# Patient Record
Sex: Male | Born: 1935 | Race: Black or African American | Hispanic: No | Smoking: Former smoker
Health system: Southern US, Community
[De-identification: ages and names within clinical notes are randomized; demographics above are authoritative.]

## PROBLEM LIST (undated history)

## (undated) DIAGNOSIS — C61 Malignant neoplasm of prostate: Secondary | ICD-10-CM

---

## 2004-03-26 ENCOUNTER — Emergency Department (HOSPITAL_COMMUNITY): Admission: EM | Admit: 2004-03-26 | Discharge: 2004-03-26 | Payer: Self-pay | Admitting: Emergency Medicine

## 2011-08-04 ENCOUNTER — Ambulatory Visit: Payer: Medicare Other

## 2013-09-22 ENCOUNTER — Encounter (HOSPITAL_COMMUNITY): Payer: Self-pay | Admitting: Emergency Medicine

## 2013-09-22 ENCOUNTER — Emergency Department (HOSPITAL_COMMUNITY)
Admission: EM | Admit: 2013-09-22 | Discharge: 2013-09-22 | Disposition: A | Discharge status: Home / Self Care | Payer: Medicare Other | Attending: Emergency Medicine | Admitting: Emergency Medicine

## 2013-09-22 DIAGNOSIS — R339 Retention of urine, unspecified: Secondary | ICD-10-CM | POA: Insufficient documentation

## 2013-09-22 DIAGNOSIS — Z87891 Personal history of nicotine dependence: Secondary | ICD-10-CM | POA: Insufficient documentation

## 2013-09-22 DIAGNOSIS — Z8546 Personal history of malignant neoplasm of prostate: Secondary | ICD-10-CM | POA: Insufficient documentation

## 2013-09-22 DIAGNOSIS — R319 Hematuria, unspecified: Secondary | ICD-10-CM | POA: Insufficient documentation

## 2013-09-22 HISTORY — DX: Malignant neoplasm of prostate: C61

## 2013-09-22 LAB — CBC WITH DIFFERENTIAL/PLATELET
BASOS ABS: 0 10*3/uL (ref 0.0–0.1)
BASOS PCT: 0 % (ref 0–1)
EOS ABS: 0 10*3/uL (ref 0.0–0.7)
EOS PCT: 0 % (ref 0–5)
HCT: 36.4 % — ABNORMAL LOW (ref 39.0–52.0)
Hemoglobin: 11.8 g/dL — ABNORMAL LOW (ref 13.0–17.0)
Lymphocytes Relative: 7 % — ABNORMAL LOW (ref 12–46)
Lymphs Abs: 0.8 10*3/uL (ref 0.7–4.0)
MCH: 27.2 pg (ref 26.0–34.0)
MCHC: 32.4 g/dL (ref 30.0–36.0)
MCV: 83.9 fL (ref 78.0–100.0)
Monocytes Absolute: 0.3 10*3/uL (ref 0.1–1.0)
Monocytes Relative: 3 % (ref 3–12)
NEUTROS PCT: 90 % — AB (ref 43–77)
Neutro Abs: 10.8 10*3/uL — ABNORMAL HIGH (ref 1.7–7.7)
PLATELETS: 275 10*3/uL (ref 150–400)
RBC: 4.34 MIL/uL (ref 4.22–5.81)
RDW: 13.5 % (ref 11.5–15.5)
WBC: 11.9 10*3/uL — ABNORMAL HIGH (ref 4.0–10.5)

## 2013-09-22 LAB — PROTIME-INR
INR: 1.06 (ref 0.00–1.49)
PROTHROMBIN TIME: 13.8 s (ref 11.6–15.2)

## 2013-09-22 LAB — COMPREHENSIVE METABOLIC PANEL
ALBUMIN: 4.3 g/dL (ref 3.5–5.2)
ALT: 10 U/L (ref 0–53)
ANION GAP: 17 — AB (ref 5–15)
AST: 18 U/L (ref 0–37)
Alkaline Phosphatase: 197 U/L — ABNORMAL HIGH (ref 39–117)
BILIRUBIN TOTAL: 0.4 mg/dL (ref 0.3–1.2)
BUN: 21 mg/dL (ref 6–23)
CALCIUM: 10.1 mg/dL (ref 8.4–10.5)
CHLORIDE: 101 meq/L (ref 96–112)
CO2: 21 mEq/L (ref 19–32)
CREATININE: 0.78 mg/dL (ref 0.50–1.35)
GFR calc Af Amer: >90 mL/min (ref >90)
GFR calc non Af Amer: 85 mL/min — ABNORMAL LOW (ref >90)
Glucose, Bld: 130 mg/dL — ABNORMAL HIGH (ref 70–99)
Potassium: 3.9 mEq/L (ref 3.7–5.3)
Sodium: 139 mEq/L (ref 137–147)
TOTAL PROTEIN: 8.3 g/dL (ref 6.0–8.3)

## 2013-09-22 LAB — URINE MICROSCOPIC-ADD ON

## 2013-09-22 LAB — URINALYSIS, ROUTINE W REFLEX MICROSCOPIC
Glucose, UA: NEGATIVE mg/dL
Ketones, ur: NEGATIVE mg/dL
NITRITE: NEGATIVE
Specific Gravity, Urine: 1.02 (ref 1.005–1.030)
UROBILINOGEN UA: 0.2 mg/dL (ref 0.0–1.0)
pH: 7.5 (ref 5.0–8.0)

## 2013-09-22 MED ORDER — CIPROFLOXACIN HCL 250 MG PO TABS: 250.0000 mg | ORAL_TABLET | Freq: Two times a day (BID) | ORAL | Status: AC

## 2013-09-22 NOTE — ED Notes (Signed)
Attempted to insert 20 fr 3way foley catheter. Unsuccessful with insertion. Hospitalist notified and may contact urology.

## 2013-09-22 NOTE — ED Notes (Signed)
Pt bladder scan 463ml. MD aware.

## 2013-09-22 NOTE — Discharge Instructions (Signed)
Urology has placed a Foley catheter and recommended that you follow up with them in clinic. We have prescribed an antibiotic (Ciprofloxacin) for a urinary tract infection. Please follow the care injections provided by her nurses for care of your Foley catheter.

## 2013-09-22 NOTE — ED Provider Notes (Signed)
CSN: [id number]     Arrival date & time 09/22/13  1536 History   First MD Initiated Contact with Patient 09/22/13 1631     Chief Complaint  Patient presents with  . Hematuria  . Dysuria  . Abdominal Pain   HPI  Wayne Lam is a 79 year old gentleman with a history of prostate cancer (remote is around 25-30 years ago) presents with hematuria, suprapubic pain, and urinary hesitancy since this morning. Patient reports that he had prostate cancer and prostatectomy remotely, and has not had any recurrent issues since. He has also not received much medical care within the interval. This morning, he noticed gross blood in his urine as well as passing moderately-sized clots into the toilet. He is also mentioned having hesitancy with his urinary stream. Otherwise, he denies any lightheadedness, fever, chills, nausea, vomiting, constipation, diarrhea, or melena or hematochezia. He denies any history of kidney stones. He also denies any use of NSAIDs or any anticoagulants.  Past Medical History  Diagnosis Date  . Prostate cancer    History reviewed. No pertinent past surgical history. History reviewed. No pertinent family history. History  Substance Use Topics  . Smoking status: Former Research scientist (life sciences)  . Smokeless tobacco: Not on file  . Alcohol Use: Yes    Review of Systems  Constitutional: Negative for fever and chills.  Respiratory: Negative for cough, chest tightness and wheezing.   Cardiovascular: Negative for chest pain and palpitations.  Gastrointestinal: Negative for nausea, vomiting, abdominal pain and diarrhea.  Genitourinary: Positive for urgency and hematuria. Negative for dysuria, flank pain, penile swelling, scrotal swelling, penile pain and testicular pain.      Allergies  Review of patient's allergies indicates no known allergies.  Home Medications   Prior to Admission medications   Medication Sig Start Date End Date Taking? Authorizing Provider  acetaminophen (TYLENOL) 500 MG  tablet Take 1,000 mg by mouth every 6 (six) hours as needed for mild pain or headache.   Yes Historical Provider, MD  ciprofloxacin (CIPRO) 250 MG tablet Take 1 tablet (250 mg total) by mouth 2 (two) times daily. 09/22/13 09/29/13  Luan Moore, MD   BP 142/76  Pulse 77  Temp(Src) 98.5 F (36.9 C) (Oral)  Resp 15  SpO2 94% Physical Exam  Constitutional: He is oriented to person, place, and time. He appears well-developed and well-nourished. No distress.  HENT:  Head: Normocephalic and atraumatic.  No conjunctival pallor.  Eyes: EOM are normal. Pupils are equal, round, and reactive to light.  Cardiovascular: Normal rate and regular rhythm.  Exam reveals no gallop and no friction rub.   No murmur heard. Pulmonary/Chest: Effort normal and breath sounds normal. No respiratory distress. He has no wheezes. He has no rales.  Abdominal: Soft. Bowel sounds are normal. He exhibits no distension. There is no tenderness.  Genitourinary:  Blood clots on patient's diaper. No penile pain. Fullness along the ventral surface of rectum.  Neurological: He is alert and oriented to person, place, and time.    ED Course  Procedures (including critical care time) Labs Review Labs Reviewed  URINALYSIS, ROUTINE W REFLEX MICROSCOPIC - Abnormal; Notable for the following:    Color, Urine RED (*)    APPearance TURBID (*)    Hgb urine dipstick LARGE (*)    Bilirubin Urine SMALL (*)    Protein, ur >300 (*)    Leukocytes, UA SMALL (*)    All other components within normal limits  CBC WITH DIFFERENTIAL - Abnormal; Notable for the  following:    WBC 11.9 (*)    Hemoglobin 11.8 (*)    HCT 36.4 (*)    Neutrophils Relative % 90 (*)    Neutro Abs 10.8 (*)    Lymphocytes Relative 7 (*)    All other components within normal limits  COMPREHENSIVE METABOLIC PANEL - Abnormal; Notable for the following:    Glucose, Bld 130 (*)    Alkaline Phosphatase 197 (*)    GFR calc non Af Amer 85 (*)    Anion gap 17 (*)     All other components within normal limits  URINE MICROSCOPIC-ADD ON - Abnormal; Notable for the following:    Bacteria, UA MANY (*)    All other components within normal limits  URINE CULTURE  PROTIME-INR    Imaging Review No results found.   EKG Interpretation None      MDM   Final diagnoses:  Urinary retention  Hematuria    Mr. Wayne Lam is a 79 year old male with a history of remote prostate cancer who presents with gross hematuria complicated by retained clots. No recent procedures or treatments within around 20 years. No complicating associated symptoms such as lightheadedness and vital signs within normal limits. Concern for recurrent disease in the context of a large prostate, though other urinary tract malignancy is also possible.  - Foley catheter  - CBC, CMP, coags  7:29 PM  - Difficulty passing Foley catheter. Will try 20 instead of 24.  - CMP remarkable for elevated alkaline phosphatase to 197.  - Patient slightly anemic at 11.8.   - UA remarkable for gross hematuria and many bacteria.  8:06 PM   - Urology consulted and will come see him.   - Urology able to insert 20 French catheter. Recommend followup as outpatient with discharge with indwelling catheter. Also cipro for UTI.    Luan Moore, MD 09/22/13 2322

## 2013-09-22 NOTE — ED Notes (Signed)
Pt c/o hematuria, dysuria, and lower abdominal cramping w/ urination.  Pain score 7/10.  Pt reports Hx of prostate "issues" but could not elaborate.  Pt is hard of hearing.

## 2013-09-22 NOTE — Consult Note (Signed)
Urology Consult  Referring physician: Marye Round, MD Reason for referral: Gross hematuria, Inability to Pl., Foley catheter  History of Present Illness: Wayne Lam is a 79 year old male. He does not seek regular medical attention. He reports a remote history of prostate cancer. He reports having surgery but has no midline incision consistent with a radical prostatectomy that would've been performed 20 years ago. My questioning he may have undergone TURP. He was cared for in the past by Wayne Lam but I could not find any records from our office. Patient reports development today of gross hematuria. He's also had some increased difficulty voiding. He had a distended bladder based on bladder scan in the ER x2 but inability for the ER staff to place a catheter. He really is unreliable regard any additional history.  Past Medical History  Diagnosis Date  . Prostate cancer    History reviewed. No pertinent past surgical history.  Medications: I have reviewed the patient's current medications. Prior to Admission:  (Not in a hospital admission)  Allergies: No Known Allergies  History reviewed. No pertinent family history.  Social History:  reports that he has quit smoking. He does not have any smokeless tobacco history on file. He reports that he drinks alcohol. He reports that he does not use illicit drugs.  ROS gross hematuria with some weakening of his urinary stream. He also complains of some increased fatigue.  Physical Exam:  Vital signs in last 24 hours: Temp:  [98.1 F (36.7 C)-98.4 F (36.9 C)] 98.4 F (36.9 C) (08/20 2030) Pulse Rate:  [66-95] 66 (08/20 2030) Resp:  [18] 18 (08/20 2030) BP: (124-152)/(61-86) 124/61 mmHg (08/20 2030) SpO2:  [94 %-97 %] 94 % (08/20 2030)  Constitutional: Vital signs reviewed. WD WN in NAD Head: Normocephalic and atraumatic   Eyes: PERRL, No scleral icterus.  Neck: Supple No  Gross JVD, mass, thyromegaly, or carotid bruit present.   Cardiovascular: RRR Pulmonary/Chest: Normal effort Abdominal: Soft. Non-tender, non-distended, bowel sounds are normal, no masses, organomegaly, or guarding present.  Large right anal hernia Genitourinary: Mild blunt meatus. No other significant abnormalities. Palpable small prostate on digital rectal exam Extremities: No cyanosis or edema  Neurological: Grossly non-focal.  Skin: Warm,very dry and intact. No rash, cyanosis   Laboratory Data:  Results for orders placed during the hospital encounter of 09/22/13 (from the past 72 hour(s))  URINALYSIS, ROUTINE W REFLEX MICROSCOPIC     Status: Abnormal   Collection Time    09/22/13  4:30 PM      Result Value Ref Range   Color, Urine RED (*) YELLOW   Comment: BIOCHEMICALS MAY BE AFFECTED BY COLOR   APPearance TURBID (*) CLEAR   Specific Gravity, Urine 1.020  1.005 - 1.030   pH 7.5  5.0 - 8.0   Glucose, UA NEGATIVE  NEGATIVE mg/dL   Hgb urine dipstick LARGE (*) NEGATIVE   Bilirubin Urine SMALL (*) NEGATIVE   Ketones, ur NEGATIVE  NEGATIVE mg/dL   Protein, ur >300 (*) NEGATIVE mg/dL   Urobilinogen, UA 0.2  0.0 - 1.0 mg/dL   Nitrite NEGATIVE  NEGATIVE   Leukocytes, UA SMALL (*) NEGATIVE  URINE MICROSCOPIC-ADD ON     Status: Abnormal   Collection Time    09/22/13  4:30 PM      Result Value Ref Range   WBC, UA 3-6  <3 WBC/hpf   RBC / HPF 11-20  <3 RBC/hpf   Bacteria, UA MANY (*) RARE   Urine-Other URINALYSIS PERFORMED  ON SUPERNATANT    CBC WITH DIFFERENTIAL     Status: Abnormal   Collection Time    09/22/13  6:11 PM      Result Value Ref Range   WBC 11.9 (*) 4.0 - 10.5 K/uL   RBC 4.34  4.22 - 5.81 MIL/uL   Hemoglobin 11.8 (*) 13.0 - 17.0 g/dL   HCT 36.4 (*) 39.0 - 52.0 %   MCV 83.9  78.0 - 100.0 fL   MCH 27.2  26.0 - 34.0 pg   MCHC 32.4  30.0 - 36.0 g/dL   RDW 13.5  11.5 - 15.5 %   Platelets 275  150 - 400 K/uL   Neutrophils Relative % 90 (*) 43 - 77 %   Neutro Abs 10.8 (*) 1.7 - 7.7 K/uL   Lymphocytes Relative 7 (*) 12 -  46 %   Lymphs Abs 0.8  0.7 - 4.0 K/uL   Monocytes Relative 3  3 - 12 %   Monocytes Absolute 0.3  0.1 - 1.0 K/uL   Eosinophils Relative 0  0 - 5 %   Eosinophils Absolute 0.0  0.0 - 0.7 K/uL   Basophils Relative 0  0 - 1 %   Basophils Absolute 0.0  0.0 - 0.1 K/uL  PROTIME-INR     Status: None   Collection Time    09/22/13  6:11 PM      Result Value Ref Range   Prothrombin Time 13.8  11.6 - 15.2 seconds   INR 1.06  0.00 - 1.49  COMPREHENSIVE METABOLIC PANEL     Status: Abnormal   Collection Time    09/22/13  6:11 PM      Result Value Ref Range   Sodium 139  137 - 147 mEq/L   Potassium 3.9  3.7 - 5.3 mEq/L   Chloride 101  96 - 112 mEq/L   CO2 21  19 - 32 mEq/L   Glucose, Bld 130 (*) 70 - 99 mg/dL   BUN 21  6 - 23 mg/dL   Creatinine, Ser 0.78  0.50 - 1.35 mg/dL   Calcium 10.1  8.4 - 10.5 mg/dL   Total Protein 8.3  6.0 - 8.3 g/dL   Albumin 4.3  3.5 - 5.2 g/dL   AST 18  0 - 37 U/L   ALT 10  0 - 53 U/L   Alkaline Phosphatase 197 (*) 39 - 117 U/L   Total Bilirubin 0.4  0.3 - 1.2 mg/dL   GFR calc non Af Amer 85 (*) >90 mL/min   GFR calc Af Amer >90  >90 mL/min   Comment: (NOTE)     The eGFR has been calculated using the CKD EPI equation.     This calculation has not been validated in all clinical situations.     eGFR's persistently <90 mL/min signify possible Chronic Kidney     Disease.   Anion gap 17 (*) 5 - 15   No results found for this or any previous visit (from the past 240 hour(s)). Creatinine:  Recent Labs  09/22/13 1811  CREATININE 0.78   Baseline Creatinine: Unknown     Impression/Assessment:  Gross hematuria. Etiology unclear. The patient reports a questionable remote history of prostate cancer. No followup now for 10-20 years per the patient.  Plan:  Bedside flexible cystoscopy was performed. The patient was prepped and draped in usual manner. Anterior urethra showed some generalized trauma from multiple catheterization attempts. There appeared to be a  false passage in the  area of what appeared to be a small prostatic urethra. Mild bladder neck contracture noted. Urine within the bladder was grossly bloody making visualization otherwise difficult. Could not rule out possibility of bladder mass. A guidewire was placed in the bladder and over a guidewire I was able to place a 20 [place]. Bladder irrigation was performed with the number of small clots obtained and a large amount of otherwise dark bloody urine. At the completion of irrigation urine was clear to very light pink in color.  Patient should have urine culture performed today and be discharged with an indwelling Foley catheter. He will need followup in our office early next week. He should have repeat cystoscopy was hematuria resolves or is improved. The patient should have PSA test and will need probably some upper tract testing as well. Would empirically cover him with some antibiotics.  Anyela Napierkowski S 09/22/2013, 9:40 PM

## 2013-09-26 NOTE — ED Provider Notes (Signed)
I saw and evaluated the patient, reviewed the resident's note and I agree with the findings and plan.  Pt presented to the ED for gross hematuria.  Remote history of prostate CA.  Bladder scan showed a component of urinary retention. Foley catheter was attempted in the ED without success. Dr Risa Grill, urology, was consulted and evaluated the patient in the ED. Successfully placed a catheter.  Bladder was irrigated. Pt was discharged in stable condition with a foley.  Follow up with urology as an outpatient.  Dorie Rank, MD 09/26/13 [phone]

## 2013-09-27 LAB — URINE CULTURE

## 2013-09-28 ENCOUNTER — Telehealth (HOSPITAL_COMMUNITY): Payer: Self-pay

## 2013-09-28 NOTE — ED Notes (Signed)
Post ED Visit - Positive Culture Follow-up  Culture report reviewed by antimicrobial stewardship pharmacist: []  Wes Dulaney, Pharm.D., BCPS [x]  Heide Guile, Pharm.D., BCPS []  Alycia Rossetti, Pharm.D., BCPS []  [place], [place].D., BCPS, AAHIVP []  Legrand Como, Pharm.D., BCPS, AAHIVP []  Hassie Bruce, Pharm.D. []  Milus Glazier, [place].D.  Positive urine culture Treated with cipro, organism sensitive to the same and no further patient follow-up is required at this time.  Ileene Musa 09/28/2013, 10:24 AM

## 2013-10-12 ENCOUNTER — Other Ambulatory Visit (HOSPITAL_COMMUNITY): Payer: Self-pay | Admitting: Urology

## 2013-10-12 DIAGNOSIS — R310 Gross hematuria: Secondary | ICD-10-CM

## 2013-10-27 ENCOUNTER — Ambulatory Visit (HOSPITAL_COMMUNITY): Admission: RE | Admit: 2013-10-27 | Payer: Self-pay | Source: Ambulatory Visit

## 2018-11-23 ENCOUNTER — Emergency Department (HOSPITAL_COMMUNITY)
Admission: EM | Admit: 2018-11-23 | Discharge: 2018-11-24 | Disposition: A | Discharge status: Home / Self Care | Payer: Self-pay | Attending: Emergency Medicine | Admitting: Emergency Medicine

## 2018-11-23 ENCOUNTER — Other Ambulatory Visit: Payer: Self-pay

## 2018-11-23 ENCOUNTER — Emergency Department (HOSPITAL_COMMUNITY): Payer: Self-pay

## 2018-11-23 ENCOUNTER — Encounter (HOSPITAL_COMMUNITY): Payer: Self-pay

## 2018-11-23 DIAGNOSIS — R2241 Localized swelling, mass and lump, right lower limb: Secondary | ICD-10-CM | POA: Diagnosis present

## 2018-11-23 DIAGNOSIS — K4090 Unilateral inguinal hernia, without obstruction or gangrene, not specified as recurrent: Secondary | ICD-10-CM | POA: Insufficient documentation

## 2018-11-23 DIAGNOSIS — Z8546 Personal history of malignant neoplasm of prostate: Secondary | ICD-10-CM | POA: Insufficient documentation

## 2018-11-23 DIAGNOSIS — Z87891 Personal history of nicotine dependence: Secondary | ICD-10-CM | POA: Diagnosis not present

## 2018-11-23 LAB — LIPASE, BLOOD: Lipase: 33 U/L (ref 11–51)

## 2018-11-23 LAB — COMPREHENSIVE METABOLIC PANEL
ALT: 13 U/L (ref 0–44)
AST: 19 U/L (ref 15–41)
Albumin: 3.8 g/dL (ref 3.5–5.0)
Alkaline Phosphatase: 212 U/L — ABNORMAL HIGH (ref 38–126)
Anion gap: 10 (ref 5–15)
BUN: 21 mg/dL (ref 8–23)
CO2: 26 mmol/L (ref 22–32)
Calcium: 9.2 mg/dL (ref 8.9–10.3)
Chloride: 102 mmol/L (ref 98–111)
Creatinine, Ser: 0.93 mg/dL (ref 0.61–1.24)
GFR calc Af Amer: >60 mL/min (ref >60)
GFR calc non Af Amer: >60 mL/min (ref >60)
Glucose, Bld: 115 mg/dL — ABNORMAL HIGH (ref 70–99)
Potassium: 4 mmol/L (ref 3.5–5.1)
Sodium: 138 mmol/L (ref 135–145)
Total Bilirubin: 0.4 mg/dL (ref 0.3–1.2)
Total Protein: 7.9 g/dL (ref 6.5–8.1)

## 2018-11-23 LAB — URINALYSIS, ROUTINE W REFLEX MICROSCOPIC
Bilirubin Urine: NEGATIVE
Glucose, UA: NEGATIVE mg/dL
Hgb urine dipstick: NEGATIVE
Ketones, ur: NEGATIVE mg/dL
Nitrite: POSITIVE — AB
Protein, ur: 100 mg/dL — AB
Specific Gravity, Urine: 1.018 (ref 1.005–1.030)
WBC, UA: >50 WBC/hpf — ABNORMAL HIGH (ref 0–5)
pH: 8 (ref 5.0–8.0)

## 2018-11-23 LAB — CBC
HCT: 39.8 % (ref 39.0–52.0)
Hemoglobin: 12.5 g/dL — ABNORMAL LOW (ref 13.0–17.0)
MCH: 26.9 pg (ref 26.0–34.0)
MCHC: 31.4 g/dL (ref 30.0–36.0)
MCV: 85.8 fL (ref 80.0–100.0)
Platelets: 289 10*3/uL (ref 150–400)
RBC: 4.64 MIL/uL (ref 4.22–5.81)
RDW: 13.4 % (ref 11.5–15.5)
WBC: 5 10*3/uL (ref 4.0–10.5)
nRBC: 0 % (ref 0.0–0.2)

## 2018-11-23 MED ORDER — IOHEXOL 300 MG/ML  SOLN
100.0000 mL | Freq: Once | INTRAMUSCULAR | Status: AC | PRN
  Administered 2018-11-23: 100 mL via INTRAVENOUS

## 2018-11-23 MED ORDER — SODIUM CHLORIDE 0.9% FLUSH: 3.0000 mL | Freq: Once | INTRAVENOUS | Status: DC

## 2018-11-23 NOTE — ED Triage Notes (Signed)
Pt accompanied by son who reports they were at an outpatient clinic today and was told he has a hernia that needs surgery. Pt denies any pain but his scrotum is very swollen.

## 2018-11-23 NOTE — ED Notes (Signed)
Patient transported to CT 

## 2018-11-23 NOTE — ED Provider Notes (Signed)
[facility] EMERGENCY DEPARTMENT Provider Note   CSN: [id number] Arrival date & time: 11/23/18  1610     History   Chief Complaint Chief Complaint  Patient presents with  . Groin Swelling  . Hernia    HPI Wayne Lam is a 84 y.o. male.     Patient is an 84 year old male with past medical history of prostate cancer.  He presents today for evaluation of swelling and discomfort in his right inguinal region.  This is been present for many months, however has been increasing in size and discomfort recently.  He denies any vomiting.  He denies any constipation or diarrhea or bloody stool.  He was seen today at an outpatient clinic, then referred here for possible surgical intervention.  The history is provided by the patient.    Past Medical History:  Diagnosis Date  . Prostate cancer ([place])     There are no active problems to display for this patient.   History reviewed. No pertinent surgical history.      Home Medications    Prior to Admission medications   Medication Sig Start Date End Date Taking? Authorizing Provider  acetaminophen (TYLENOL) 500 MG tablet Take 1,000 mg by mouth every 6 (six) hours as needed for mild pain or headache.    [provider]    Family History No family history on file.  Social History Social History   Tobacco Use  . Smoking status: Former Smoker  Substance Use Topics  . Alcohol use: Yes  . Drug use: No     Allergies   Patient has no known allergies.   Review of Systems Review of Systems  All other systems reviewed and are negative.    Physical Exam Updated Vital Signs BP (!) 161/76 (BP Location: Right Arm)   Pulse (!) 52   Temp 97.7 F (36.5 C) (Oral)   Resp 16   SpO2 98%   Physical Exam Vitals signs and nursing note reviewed.  Constitutional:      General: He is not in acute distress.    Appearance: He is well-developed. He is not diaphoretic.  HENT:     Head: Normocephalic  and atraumatic.  Neck:     Musculoskeletal: Normal range of motion and neck supple.  Cardiovascular:     Rate and Rhythm: Normal rate and regular rhythm.     Heart sounds: No murmur. No friction rub.  Pulmonary:     Effort: Pulmonary effort is normal. No respiratory distress.     Breath sounds: Normal breath sounds. No wheezing or rales.  Abdominal:     General: Bowel sounds are normal. There is no distension.     Palpations: Abdomen is soft.     Tenderness: There is no abdominal tenderness.     Comments: There is a large right-sided inguinal hernia present that extends into the scrotum.  Musculoskeletal: Normal range of motion.  Skin:    General: Skin is warm and dry.  Neurological:     Mental Status: He is alert and oriented to person, place, and time.     Coordination: Coordination normal.      ED Treatments / Results  Labs (all labs ordered are listed, but only abnormal results are displayed) Labs Reviewed  COMPREHENSIVE METABOLIC PANEL - Abnormal; Notable for the following components:      Result Value   Glucose, Bld 115 (*)    Alkaline Phosphatase 212 (*)    All other components within normal  limits  CBC - Abnormal; Notable for the following components:   Hemoglobin 12.5 (*)    All other components within normal limits  URINALYSIS, ROUTINE W REFLEX MICROSCOPIC - Abnormal; Notable for the following components:   APPearance CLOUDY (*)    Protein, ur 100 (*)    Nitrite POSITIVE (*)    Leukocytes,Ua MODERATE (*)    WBC, UA >50 (*)    Bacteria, UA MANY (*)    All other components within normal limits  LIPASE, BLOOD    EKG None  Radiology No results found.  Procedures Procedures (including critical care time)  Medications Ordered in ED Medications  sodium chloride flush (NS) 0.9 % injection 3 mL (has no administration in time range)     Initial Impression / Assessment and Plan / ED Course  I have reviewed the triage vital signs and the nursing notes.   Pertinent labs & imaging results that were available during my care of the patient were reviewed by me and considered in my medical decision making (see chart for details).  Patient presenting with swelling to his groin.  He was referred here for possible surgery for hernia repair.  Patient has a partially reducible, very large inguinal hernia with no other abnormal findings on CT scan.  There is no evidence for bowel obstruction or strangulation.  Patient will be referred to general surgery as an outpatient.  Nothing today appears emergent.  Final Clinical Impressions(s) / ED Diagnoses   Final diagnoses:  None    ED Discharge Orders    None       Veryl Speak, MD 11/24/18 [phone]

## 2018-11-24 NOTE — ED Notes (Signed)
Patient verbalizes understanding of discharge instructions. Opportunity for questioning and answers were provided. Armband removed by staff, pt discharged from ED.  

## 2018-11-24 NOTE — Discharge Instructions (Addendum)
Call central [facility] office to schedule an appointment regarding your hernia.  The contact information has been provided in this discharge summary for you to call and make these arrangements.  Return to the emergency department in the meantime if your pain worsens, you develop vomiting, bloody stools, or other new and concerning symptoms.

## 2018-12-02 NOTE — Patient Instructions (Addendum)
DUE TO COVID-19 ONLY ONE VISITOR IS ALLOWED TO COME WITH YOU AND STAY IN THE WAITING ROOM ONLY DURING PRE OP AND PROCEDURE DAY OF SURGERY. THE 1 VISITOR MAY VISIT WITH YOU AFTER SURGERY IN YOUR PRIVATE ROOM DURING VISITING HOURS ONLY!  YOU NEED TO HAVE A COVID 19 TEST ON__10/31_____ @_______ , THIS TEST MUST BE DONE BEFORE SURGERY, COME  [place], [address] [place] , [address].  ([place]) ONCE YOUR COVID TEST IS COMPLETED, PLEASE BEGIN THE QUARANTINE INSTRUCTIONS AS OUTLINED IN YOUR HANDOUT.                [place]    Your procedure is scheduled on: 12/08/18   Report to [facility] Main  Entrance   Report to admitting at  12:00 PM     Call this number if you have problems the morning of surgery [phone]    Do not eat food After Midnight.   YOU MAY HAVE CLEAR LIQUIDS FROM MIDNIGHT UNTIL 11:00 AM    CLEAR LIQUID DIET   Foods Allowed                                                                     Foods Excluded  Coffee and tea, regular and decaf                             liquids that you cannot  Plain Jell-O any favor except red or purple                                           see through such as: Fruit ices (not with fruit pulp)                                     milk, soups, orange juice  Iced Popsicles                                    All solid food Carbonated beverages, regular and diet                                    Cranberry, grape and apple juices Sports drinks like Gatorade Lightly seasoned clear broth or consume(fat free) Sugar, honey syrup     . At 11:00 AM Please finish the prescribed Pre-Surgery  drink.   Nothing by mouth after you finish the  drink !   BRUSH YOUR TEETH MORNING OF SURGERY AND RINSE YOUR MOUTH OUT, NO CHEWING GUM CANDY OR MINTS.     Take these medicines the morning of surgery with A SIP OF WATER: none                                 You may not have any metal on your body including  piercings              Do not wear jewelry,  lotions, powders or  deodorant                     Men may shave face and neck.   Do not bring valuables to the hospital. [place].  Contacts, dentures or bridgework may not be worn into surgery.      Patients discharged the day of surgery will not be allowed to drive home.   IF YOU ARE HAVING SURGERY AND GOING HOME THE SAME DAY, YOU MUST HAVE AN ADULT TO DRIVE YOU HOME AND BE WITH YOU FOR 24 HOURS.   YOU MAY GO HOME BY TAXI OR UBER OR ORTHERWISE, BUT AN ADULT MUST ACCOMPANY YOU HOME AND STAY WITH YOU FOR 24 HOURS.  Name and phone number of your driver:  Special Instructions: N/A              Please read over the following fact sheets you were given: _____________________________________________________________________             [facility] - Preparing for Surgery  Before surgery, you can play an important role.   Because skin is not sterile, your skin needs to be as free of germs as possible .  You can reduce the number of germs on your skin by washing with CHG (chlorahexidine gluconate) soap before surgery.   CHG is an antiseptic cleaner which kills germs and bonds with the skin to continue killing germs even after washing. Please DO NOT use if you have an allergy to CHG or antibacterial soaps .  If your skin becomes reddened/irritated stop using the CHG and inform your nurse when you arrive at Short Stay.  You may shave your face/neck.  Please follow these instructions carefully:  1.  Shower with CHG Soap the night before surgery and the  morning of Surgery.  2.  If you choose to wash your hair, wash your hair first as usual with your  normal  shampoo.  3.  After you shampoo, rinse your hair and body thoroughly to remove the  shampoo.                                        4.  Use CHG as you would any other liquid soap.  You can apply chg directly  to the skin and wash                        Gently with a scrungie or clean washcloth.  5.  Apply the CHG Soap to your body ONLY FROM THE NECK DOWN.   Do not use on face/ open                           Wound or open sores. Avoid contact with eyes, ears mouth and genitals (private parts).                       Wash face,  Genitals (private parts) with your normal soap.             6.  Wash thoroughly, paying special attention to the area  where your surgery  will be performed.  7.  Thoroughly rinse your body with warm water from the neck down.  8.  DO NOT shower/wash with your normal soap after using and rinsing off  the CHG Soap.             9.  Pat yourself dry with a clean towel.            10.  Wear clean pajamas.            11.  Place clean sheets on your bed the night of your first shower and do not  sleep with pets. Day of Surgery : Do not apply any lotions/deodorants the morning of surgery.  Please wear clean clothes to the hospital/surgery center.  FAILURE TO FOLLOW THESE INSTRUCTIONS MAY RESULT IN THE CANCELLATION OF YOUR SURGERY PATIENT SIGNATURE_________________________________  NURSE SIGNATURE__________________________________  ________________________________________________________________________

## 2018-12-03 ENCOUNTER — Ambulatory Visit: Payer: Self-pay | Admitting: General Surgery

## 2018-12-04 ENCOUNTER — Other Ambulatory Visit (HOSPITAL_COMMUNITY)
Admission: RE | Admit: 2018-12-04 | Discharge: 2018-12-04 | Disposition: A | Discharge status: Home / Self Care | Payer: Self-pay | Source: Ambulatory Visit | Attending: General Surgery | Admitting: General Surgery

## 2018-12-04 DIAGNOSIS — Z01812 Encounter for preprocedural laboratory examination: Secondary | ICD-10-CM | POA: Insufficient documentation

## 2018-12-04 DIAGNOSIS — Z20828 Contact with and (suspected) exposure to other viral communicable diseases: Secondary | ICD-10-CM | POA: Insufficient documentation

## 2018-12-05 LAB — NOVEL CORONAVIRUS, NAA (HOSP ORDER, SEND-OUT TO REF LAB; TAT 18-24 HRS): SARS-CoV-2, NAA: NOT DETECTED

## 2018-12-06 ENCOUNTER — Other Ambulatory Visit: Payer: Self-pay

## 2018-12-06 ENCOUNTER — Encounter (HOSPITAL_COMMUNITY)
Admission: RE | Admit: 2018-12-06 | Discharge: 2018-12-06 | Disposition: A | Discharge status: Home / Self Care | Payer: Self-pay | Source: Ambulatory Visit | Attending: General Surgery | Admitting: General Surgery

## 2018-12-06 ENCOUNTER — Encounter (HOSPITAL_COMMUNITY): Payer: Self-pay

## 2018-12-06 DIAGNOSIS — Z01812 Encounter for preprocedural laboratory examination: Secondary | ICD-10-CM | POA: Insufficient documentation

## 2018-12-06 DIAGNOSIS — K4090 Unilateral inguinal hernia, without obstruction or gangrene, not specified as recurrent: Secondary | ICD-10-CM | POA: Insufficient documentation

## 2018-12-06 LAB — CBC
HCT: 38.7 % — ABNORMAL LOW (ref 39.0–52.0)
Hemoglobin: 12 g/dL — ABNORMAL LOW (ref 13.0–17.0)
MCH: 26.7 pg (ref 26.0–34.0)
MCHC: 31 g/dL (ref 30.0–36.0)
MCV: 86.2 fL (ref 80.0–100.0)
Platelets: 281 10*3/uL (ref 150–400)
RBC: 4.49 MIL/uL (ref 4.22–5.81)
RDW: 13.5 % (ref 11.5–15.5)
WBC: 5.4 10*3/uL (ref 4.0–10.5)
nRBC: 0 % (ref 0.0–0.2)

## 2018-12-06 MED ORDER — ENSURE PRE-SURGERY PO LIQD
296.0000 mL | Freq: Once | ORAL | Status: DC
  Filled 2018-12-06: qty 296

## 2018-12-06 NOTE — Progress Notes (Signed)
PCP - none Cardiologist - none  Chest x-ray - no EKG - no Stress Test - no ECHO - no Cardiac Cath - no  Sleep Study - no CPAP -   Fasting Blood Sugar - NA Checks Blood Sugar _____ times a day  Blood Thinner Instructions:NA Aspirin Instructions: Last Dose:  Anesthesia review:   Patient denies shortness of breath, fever, cough and chest pain at PAT appointment yes  Patient verbalized understanding of instructions that were given to them at the PAT appointment. Patient was also instructed that they will need to review over the PAT instructions again at home before surgery. Yes Pt is very HOH but was told to bring his hearing aids with him to the hospital

## 2018-12-08 ENCOUNTER — Ambulatory Visit (HOSPITAL_COMMUNITY): Payer: Self-pay | Admitting: Anesthesiology

## 2018-12-08 ENCOUNTER — Other Ambulatory Visit: Payer: Self-pay

## 2018-12-08 ENCOUNTER — Encounter (HOSPITAL_COMMUNITY): Payer: Self-pay | Admitting: Anesthesiology

## 2018-12-08 ENCOUNTER — Ambulatory Visit (HOSPITAL_COMMUNITY): Payer: Self-pay | Admitting: Physician Assistant

## 2018-12-08 ENCOUNTER — Encounter (HOSPITAL_COMMUNITY)
Admission: RE | Disposition: A | Discharge status: Home / Self Care | Payer: Self-pay | Source: Home / Self Care | Attending: General Surgery

## 2018-12-08 ENCOUNTER — Ambulatory Visit (HOSPITAL_COMMUNITY)
Admission: RE | Admit: 2018-12-08 | Discharge: 2018-12-08 | Disposition: A | Discharge status: Home / Self Care | Payer: Self-pay | Attending: General Surgery | Admitting: General Surgery

## 2018-12-08 DIAGNOSIS — Z8546 Personal history of malignant neoplasm of prostate: Secondary | ICD-10-CM | POA: Insufficient documentation

## 2018-12-08 DIAGNOSIS — K4090 Unilateral inguinal hernia, without obstruction or gangrene, not specified as recurrent: Secondary | ICD-10-CM | POA: Insufficient documentation

## 2018-12-08 DIAGNOSIS — Z87891 Personal history of nicotine dependence: Secondary | ICD-10-CM | POA: Insufficient documentation

## 2018-12-08 HISTORY — PX: INGUINAL HERNIA REPAIR: SHX194

## 2018-12-08 SURGERY — REPAIR, HERNIA, INGUINAL, ADULT
Anesthesia: Regional | Laterality: Right

## 2018-12-08 MED ORDER — DEXAMETHASONE SODIUM PHOSPHATE 10 MG/ML IJ SOLN
INTRAMUSCULAR | Status: AC
  Filled 2018-12-08: qty 1

## 2018-12-08 MED ORDER — CHLORHEXIDINE GLUCONATE CLOTH 2 % EX PADS: 6.0000 | MEDICATED_PAD | Freq: Once | CUTANEOUS | Status: DC

## 2018-12-08 MED ORDER — BUPIVACAINE LIPOSOME 1.3 % IJ SUSP
20.0000 mL | Freq: Once | INTRAMUSCULAR | Status: DC
  Filled 2018-12-08: qty 20

## 2018-12-08 MED ORDER — PROPOFOL 10 MG/ML IV BOLUS
INTRAVENOUS | Status: AC
  Filled 2018-12-08: qty 20

## 2018-12-08 MED ORDER — SUCCINYLCHOLINE CHLORIDE 200 MG/10ML IV SOSY
PREFILLED_SYRINGE | INTRAVENOUS | Status: DC | PRN
  Administered 2018-12-08: 100 mg via INTRAVENOUS

## 2018-12-08 MED ORDER — IBUPROFEN 800 MG PO TABS: 800.0000 mg | ORAL_TABLET | Freq: Three times a day (TID) | ORAL | 0 refills | Status: AC | PRN

## 2018-12-08 MED ORDER — ONDANSETRON HCL 4 MG/2ML IJ SOLN
INTRAMUSCULAR | Status: AC
  Filled 2018-12-08: qty 2

## 2018-12-08 MED ORDER — DEXAMETHASONE SODIUM PHOSPHATE 10 MG/ML IJ SOLN
INTRAMUSCULAR | Status: DC | PRN
  Administered 2018-12-08: 10 mg via INTRAVENOUS

## 2018-12-08 MED ORDER — 0.9 % SODIUM CHLORIDE (POUR BTL) OPTIME
TOPICAL | Status: DC | PRN
  Administered 2018-12-08: 13:00:00 1000 mL

## 2018-12-08 MED ORDER — EPHEDRINE 5 MG/ML INJ
INTRAVENOUS | Status: AC
  Filled 2018-12-08: qty 10

## 2018-12-08 MED ORDER — FENTANYL CITRATE (PF) 100 MCG/2ML IJ SOLN
50.0000 ug | INTRAMUSCULAR | Status: AC
  Administered 2018-12-08: 12:00:00 100 ug via INTRAVENOUS
  Filled 2018-12-08: qty 2

## 2018-12-08 MED ORDER — ACETAMINOPHEN 500 MG PO TABS
1000.0000 mg | ORAL_TABLET | ORAL | Status: AC
  Administered 2018-12-08: 12:00:00 1000 mg via ORAL
  Filled 2018-12-08: qty 2

## 2018-12-08 MED ORDER — PROPOFOL 10 MG/ML IV BOLUS
INTRAVENOUS | Status: DC | PRN
  Administered 2018-12-08: 100 mg via INTRAVENOUS

## 2018-12-08 MED ORDER — ONDANSETRON HCL 4 MG/2ML IJ SOLN: 4.0000 mg | Freq: Once | INTRAMUSCULAR | Status: DC | PRN

## 2018-12-08 MED ORDER — SUCCINYLCHOLINE CHLORIDE 200 MG/10ML IV SOSY
PREFILLED_SYRINGE | INTRAVENOUS | Status: AC
  Filled 2018-12-08: qty 10

## 2018-12-08 MED ORDER — SUGAMMADEX SODIUM 200 MG/2ML IV SOLN
INTRAVENOUS | Status: DC | PRN
  Administered 2018-12-08: 125 mg via INTRAVENOUS

## 2018-12-08 MED ORDER — FENTANYL CITRATE (PF) 100 MCG/2ML IJ SOLN
INTRAMUSCULAR | Status: AC
  Filled 2018-12-08: qty 2

## 2018-12-08 MED ORDER — ROPIVACAINE HCL 5 MG/ML IJ SOLN
INTRAMUSCULAR | Status: DC | PRN
  Administered 2018-12-08: 30 mL via PERINEURAL

## 2018-12-08 MED ORDER — ONDANSETRON HCL 4 MG/2ML IJ SOLN
INTRAMUSCULAR | Status: DC | PRN
  Administered 2018-12-08: 4 mg via INTRAVENOUS

## 2018-12-08 MED ORDER — LACTATED RINGERS IV SOLN
INTRAVENOUS | Status: DC
  Administered 2018-12-08: 12:00:00 via INTRAVENOUS

## 2018-12-08 MED ORDER — MIDAZOLAM HCL 2 MG/2ML IJ SOLN
1.0000 mg | INTRAMUSCULAR | Status: DC
  Filled 2018-12-08: qty 2

## 2018-12-08 MED ORDER — OXYCODONE HCL 5 MG PO TABS: 5.0000 mg | ORAL_TABLET | Freq: Four times a day (QID) | ORAL | 0 refills | Status: AC | PRN

## 2018-12-08 MED ORDER — CEFAZOLIN SODIUM-DEXTROSE 2-4 GM/100ML-% IV SOLN
2.0000 g | INTRAVENOUS | Status: AC
  Administered 2018-12-08: 2 g via INTRAVENOUS
  Filled 2018-12-08: qty 100

## 2018-12-08 MED ORDER — FENTANYL CITRATE (PF) 100 MCG/2ML IJ SOLN: 25.0000 ug | INTRAMUSCULAR | Status: DC | PRN

## 2018-12-08 MED ORDER — BUPIVACAINE HCL 0.5 % IJ SOLN
INTRAMUSCULAR | Status: DC | PRN
  Administered 2018-12-08: 30 mL

## 2018-12-08 MED ORDER — BUPIVACAINE HCL (PF) 0.5 % IJ SOLN
INTRAMUSCULAR | Status: AC
  Filled 2018-12-08: qty 30

## 2018-12-08 MED ORDER — LIDOCAINE 2% (20 MG/ML) 5 ML SYRINGE
INTRAMUSCULAR | Status: DC | PRN
  Administered 2018-12-08: 60 mg via INTRAVENOUS

## 2018-12-08 MED ORDER — LIDOCAINE 2% (20 MG/ML) 5 ML SYRINGE
INTRAMUSCULAR | Status: AC
  Filled 2018-12-08: qty 5

## 2018-12-08 MED ORDER — EPHEDRINE SULFATE-NACL 50-0.9 MG/10ML-% IV SOSY
PREFILLED_SYRINGE | INTRAVENOUS | Status: DC | PRN
  Administered 2018-12-08 (×2): 10 mg via INTRAVENOUS

## 2018-12-08 MED ORDER — FENTANYL CITRATE (PF) 100 MCG/2ML IJ SOLN
INTRAMUSCULAR | Status: DC | PRN
  Administered 2018-12-08 (×2): 50 ug via INTRAVENOUS

## 2018-12-08 MED ORDER — ROCURONIUM BROMIDE 10 MG/ML (PF) SYRINGE
PREFILLED_SYRINGE | INTRAVENOUS | Status: DC | PRN
  Administered 2018-12-08: 30 mg via INTRAVENOUS

## 2018-12-08 MED ORDER — CELECOXIB 200 MG PO CAPS
200.0000 mg | ORAL_CAPSULE | ORAL | Status: AC
  Administered 2018-12-08: 200 mg via ORAL
  Filled 2018-12-08: qty 1

## 2018-12-08 MED ORDER — ROCURONIUM BROMIDE 10 MG/ML (PF) SYRINGE
PREFILLED_SYRINGE | INTRAVENOUS | Status: AC
  Filled 2018-12-08: qty 10

## 2018-12-08 SURGICAL SUPPLY — 51 items
ADH SKN CLS APL DERMABOND .7 (GAUZE/BANDAGES/DRESSINGS) ×1
APL PRP STRL LF DISP 70% ISPRP (MISCELLANEOUS) ×1
APL SKNCLS STERI-STRIP NONHPOA (GAUZE/BANDAGES/DRESSINGS)
BENZOIN TINCTURE PRP APPL 2/3 (GAUZE/BANDAGES/DRESSINGS) ×1 IMPLANT
BLADE SURG 15 STRL LF DISP TIS (BLADE) ×1 IMPLANT
BLADE SURG 15 STRL SS (BLADE) ×2
CELLS DAT CNTRL 66122 CELL SVR (MISCELLANEOUS) IMPLANT
CHLORAPREP W/TINT 26 (MISCELLANEOUS) ×2 IMPLANT
COVER SURGICAL LIGHT HANDLE (MISCELLANEOUS) ×2 IMPLANT
COVER WAND RF STERILE (DRAPES) ×2 IMPLANT
DECANTER SPIKE VIAL GLASS SM (MISCELLANEOUS) ×2 IMPLANT
DERMABOND ADVANCED (GAUZE/BANDAGES/DRESSINGS) ×1
DERMABOND ADVANCED .7 DNX12 (GAUZE/BANDAGES/DRESSINGS) IMPLANT
DRAIN PENROSE 18X1/2 LTX STRL (DRAIN) ×1 IMPLANT
DRAPE LAPAROTOMY TRNSV 102X78 (DRAPES) ×2 IMPLANT
DRSG TEGADERM 4X4.75 (GAUZE/BANDAGES/DRESSINGS) IMPLANT
DRSG TELFA PLUS 4X6 ADH ISLAND (GAUZE/BANDAGES/DRESSINGS) ×1 IMPLANT
ELECT PENCIL ROCKER SW 15FT (MISCELLANEOUS) ×2 IMPLANT
ELECT REM PT RETURN 15FT ADLT (MISCELLANEOUS) ×2 IMPLANT
GAUZE SPONGE 4X4 12PLY STRL (GAUZE/BANDAGES/DRESSINGS) ×1 IMPLANT
GLOVE BIOGEL PI IND STRL 7.0 (GLOVE) IMPLANT
GLOVE BIOGEL PI INDICATOR 7.0 (GLOVE)
GLOVE SURG SS PI 7.0 STRL IVOR (GLOVE) ×2 IMPLANT
GOWN STRL REUS W/TWL LRG LVL3 (GOWN DISPOSABLE) ×2 IMPLANT
GOWN STRL REUS W/TWL XL LVL3 (GOWN DISPOSABLE) ×2 IMPLANT
KIT BASIN OR (CUSTOM PROCEDURE TRAY) ×2 IMPLANT
KIT TURNOVER KIT A (KITS) ×1 IMPLANT
MESH HERNIA 3X6 (Mesh General) ×1 IMPLANT
NEEDLE HYPO 22GX1.5 SAFETY (NEEDLE) ×2 IMPLANT
PACK BASIC VI WITH GOWN DISP (CUSTOM PROCEDURE TRAY) ×2 IMPLANT
RETRACTOR WND ALEXIS 18 MED (MISCELLANEOUS) IMPLANT
RETRACTOR WND ALEXIS 25 LRG (MISCELLANEOUS) IMPLANT
RTRCTR WOUND ALEXIS 18CM MED (MISCELLANEOUS)
RTRCTR WOUND ALEXIS 25CM LRG (MISCELLANEOUS)
SOL PREP PROV IODINE SCRUB 4OZ (MISCELLANEOUS) ×1 IMPLANT
SPONGE LAP 18X18 RF (DISPOSABLE) IMPLANT
SPONGE LAP 4X18 RFD (DISPOSABLE) ×1 IMPLANT
STRIP CLOSURE SKIN 1/2X4 (GAUZE/BANDAGES/DRESSINGS) IMPLANT
SUT MNCRL AB 4-0 PS2 18 (SUTURE) ×2 IMPLANT
SUT PDS AB 2-0 CT2 27 (SUTURE) ×1 IMPLANT
SUT PROLENE 2 0 CT2 30 (SUTURE) ×4 IMPLANT
SUT SILK 2 0 SH CR/8 (SUTURE) IMPLANT
SUT VIC AB 2-0 CT1 27 (SUTURE) ×2
SUT VIC AB 2-0 CT1 TAPERPNT 27 (SUTURE) ×1 IMPLANT
SUT VIC AB 3-0 SH 27 (SUTURE) ×4
SUT VIC AB 3-0 SH 27XBRD (SUTURE) ×2 IMPLANT
SYR BULB IRRIGATION 50ML (SYRINGE) ×2 IMPLANT
SYR CONTROL 10ML LL (SYRINGE) ×2 IMPLANT
TOWEL OR 17X26 10 PK STRL BLUE (TOWEL DISPOSABLE) ×2 IMPLANT
TOWEL OR NON WOVEN STRL DISP B (DISPOSABLE) ×2 IMPLANT
YANKAUER SUCT BULB TIP 10FT TU (MISCELLANEOUS) ×2 IMPLANT

## 2018-12-08 NOTE — Op Note (Signed)
Preop diagnosis: right inguinal hernia  Postop diagnosis: right direct inguinal hernia  Procedure: open Right inguinal hernia repair with mesh  Surgeon: Gurney Maxin, M.D.  Asst: none  Anesthesia: Gen.   Indications for procedure: Wayne Lam is a 84 y.o. male with symptoms of pain and enlarging Right inguinal hernia(s). After discussing risks, alternatives and benefits he decided on open repair and was brought to day surgery for repair.  Description of procedure: The patient was brought into the operative suite, placed supine. Anesthesia was administered with endotracheal tube. Patient was strapped in place. The patient was prepped and draped in the usual sterile fashion.  The anterior superior iliac spine and pubic tubercle were identified on the Right side. An incision was made 1cm above the connecting line, representative of the location of the inguinal ligament. The subcutaneous tissue was bluntly dissected, scarpa's fascia was dissected away. The external abdominal oblique fascia was identified and sharply opened down to the external inguinal ring. The conjoint tendon and inguinal ligament were identified. The cord structures and sac were dissected free of the surrounding tissue in 360 degrees. A penrose drain was used to encircle the contents. The cremasteric fibers were dissected free of the contents of the cord and hernia sac. The hernia sac was entered and reduced. The majority of small intestine and right colon was found in the sac. The cord structures (vessels and vas deferens) were identified and carefully dissected away from the hernia sac. The defect appeared to be direct and large. The hernia sac was dissected down to the internal inguinal ring. Preperitoneal fat was identified showing appropriate dissection. The sac was divided and sutured closed with 3-0 vicryl. The sac was then reduced into the preperitoneal space. 3 0 PDS was used to bring the inguinal ligament to the  conjoint tendon to recreate the floor A 3x6 Bard  mesh was then used to close the defect and reinforce the floor. The mesh was sutured to the lacunar ligament and inguinal ligament using a 2-0 prolene in running fashion. Next the superior edge of the mesh was sutured to the conjoined tendon using a 2-0 running Prolene. An additional 2-0 Prolene was used to suture the tail ends of the mesh together re-creating the deep ring. Cord structures are running in a neutral position through the mesh. Next the external abdominal oblique fascia was closed with a 2-0 Vicryl in running fashion to re-create the external inguinal ring. There was concern for a mass lateral and inferior to the inguinal ligament. No femoral hernia was identified. Dissection to the area showed a simple fatty collection that was left in place, likely secondary to the large hernia putting pressure on the area. Scarpa's fascia was closed with 3-0 Vicryl in running fashion. Skin was closed with a 4-0 Monocryl subcuticular stitch in running fashion. Dermabond place for dressing. Patient woke from anesthesia and brought to PACU in stable condition. All counts are correct.  Findings: right direct large inguinal hernia with colon and intestine in sac, fatty mass below inguinal ligament laterally  Specimen: none  Blood loss: 30 ml  Local anesthesia: 30 ml Marcaine mix  Complications: none  Implant: 3 x 6 bard mesh  Gurney Maxin, M.D. General, Bariatric, & Minimally Invasive Surgery [facility], [place] 1:56 PM 12/08/2018

## 2018-12-08 NOTE — H&P (Signed)
Wayne Lam is an 84 y.o. male.   Chief Complaint: hernia HPI: 84 yo male with large right hernia that has been present for several years. It does not cause pain but is uncomfortable due to size. He denies nausea and vomiting. He presents for hernia repair  Past Medical History:  Diagnosis Date  . Prostate cancer [facility])     History reviewed. No pertinent surgical history.  History reviewed. No pertinent family history. Social History:  reports that he quit smoking about 26 years ago. He has never used smokeless tobacco. He reports current alcohol use. He reports that he does not use drugs.  Allergies: No Known Allergies  No medications prior to admission.    No results found for this or any previous visit (from the past 48 hour(s)). No results found.  Review of Systems  Constitutional: Negative for chills and fever.  HENT: Negative for hearing loss.   Eyes: Negative for blurred vision and double vision.  Respiratory: Negative for cough and hemoptysis.   Cardiovascular: Negative for chest pain and palpitations.  Gastrointestinal: Negative for abdominal pain, nausea and vomiting.  Genitourinary: Negative for dysuria and urgency.  Musculoskeletal: Negative for myalgias and neck pain.  Skin: Negative for itching and rash.  Neurological: Negative for dizziness, tingling and headaches.  Endo/Heme/Allergies: Does not bruise/bleed easily.  Psychiatric/Behavioral: Negative for depression and suicidal ideas.    Blood pressure (!) 158/73, pulse 63, temperature 98.2 F (36.8 C), temperature source Oral, resp. rate 18, height 5\' 5"  (1.651 m), weight 57.9 kg, SpO2 100 %. Physical Exam  Vitals reviewed. Constitutional: He is oriented to person, place, and time. He appears well-developed and well-nourished.  HENT:  Head: Normocephalic and atraumatic.  Eyes: Pupils are equal, round, and reactive to light. Conjunctivae and EOM are normal.  Neck: Normal range of motion. Neck supple.   Cardiovascular: Normal rate and regular rhythm.  Respiratory: Effort normal and breath sounds normal.  GI: Soft. Bowel sounds are normal. He exhibits no distension. There is no abdominal tenderness.  Large right inguinal hernia with some firmness in the lateral groin as well  Musculoskeletal: Normal range of motion.  Neurological: He is alert and oriented to person, place, and time.  Skin: Skin is warm and dry.  Psychiatric: He has a normal mood and affect. His behavior is normal.     Assessment/Plan 84 yo male with right inguinal hernia -open right inguinal hernia repair -planned outpatient procedure -ERAS protocol  Mickeal Skinner, MD 12/08/2018, 12:06 PM

## 2018-12-08 NOTE — Transfer of Care (Signed)
Immediate Anesthesia Transfer of Care Note  Patient: Wayne Lam  Procedure(s) Performed: OPEN RIGHT INGUINAL HERNIA REPAIR WITH INSERTION OF MESH (Right )  Patient Location: PACU  Anesthesia Type:General  Level of Consciousness: drowsy  Airway & Oxygen Therapy: Patient Spontanous Breathing and Patient connected to face mask oxygen  Post-op Assessment: Report given to RN and Post -op Vital signs reviewed and stable  Post vital signs: Reviewed and stable  Last Vitals:  Vitals Value Taken Time  BP    Temp    Pulse    Resp    SpO2      Last Pain:  Vitals:   12/08/18 1215  TempSrc:   PainSc: 0-No pain         Complications: No apparent anesthesia complications

## 2018-12-08 NOTE — Anesthesia Procedure Notes (Signed)
Anesthesia Regional Block: TAP block   Pre-Anesthetic Checklist: ,, timeout performed, Correct Patient, Correct Site, Correct Laterality, Correct Procedure,, site marked, risks and benefits discussed, Surgical consent,  Pre-op evaluation,  At surgeon's request and post-op pain management  Laterality: Right  Prep: chloraprep       Needles:  Injection technique: Single-shot  Needle Type: Echogenic Stimulator Needle     Needle Length: 10cm  Needle Gauge: 21     Additional Needles:   Procedures:,,,, ultrasound used (permanent image in chart),,,,  Narrative:  Start time: 12/08/2018 12:05 PM End time: 12/08/2018 12:15 PM Injection made incrementally with aspirations every 5 mL.  Performed by: Personally  Anesthesiologist: Murvin Natal, MD  Additional Notes: Functioning IV was confirmed and monitors were applied.  A 117mm 21ga [place] echogenic stimulator needle was used. Sterile prep, hand hygiene and sterile gloves were used.  Negative aspiration and negative test dose prior to incremental administration of local anesthetic. The patient tolerated the procedure well.

## 2018-12-08 NOTE — Progress Notes (Signed)
Contacted patient son- Adison Love Brooke Bonito. To answer all questions on preop checklist.   Pt. Able to answer some quesitons; but hard of hearing and difficult to complete.   Phone number of son Blohm) and daughter on chart for MD to contact post surgery.

## 2018-12-08 NOTE — Anesthesia Preprocedure Evaluation (Addendum)
Anesthesia Evaluation  Patient identified by MRN, date of birth, ID band Patient awake    Reviewed: Allergy & Precautions, NPO status , Patient's Chart, lab work & pertinent test results  Airway Mallampati: III  TM Distance: >3 FB Neck ROM: Full    Dental  (+) Edentulous Upper, Edentulous Lower   Pulmonary former smoker,    Pulmonary exam normal breath sounds clear to auscultation       Cardiovascular negative cardio ROS Normal cardiovascular exam Rhythm:Regular Rate:Normal     Neuro/Psych negative neurological ROS  negative psych ROS   GI/Hepatic negative GI ROS, Neg liver ROS,   Endo/Other  negative endocrine ROS  Renal/GU negative Renal ROS     Musculoskeletal negative musculoskeletal ROS (+)   Abdominal   Peds  Hematology  (+) anemia ,   Anesthesia Other Findings RIGHT INGUINAL HERNIA  Reproductive/Obstetrics                            Anesthesia Physical Anesthesia Plan  ASA: II  Anesthesia Plan: General and Regional   Post-op Pain Management: GA combined w/ Regional for post-op pain   Induction: Intravenous  PONV Risk Score and Plan: 2 and Ondansetron, Dexamethasone and Treatment may vary due to age or medical condition  Airway Management Planned: Oral ETT  Additional Equipment:   Intra-op Plan:   Post-operative Plan: Extubation in OR  Informed Consent: I have reviewed the patients History and Physical, chart, labs and discussed the procedure including the risks, benefits and alternatives for the proposed anesthesia with the patient or authorized representative who has indicated his/her understanding and acceptance.     Dental advisory given  Plan Discussed with: CRNA  Anesthesia Plan Comments:         Anesthesia Quick Evaluation

## 2018-12-08 NOTE — Anesthesia Postprocedure Evaluation (Signed)
Anesthesia Post Note  Patient: Masaichi Vanacker Smyre  Procedure(s) Performed: OPEN RIGHT INGUINAL HERNIA REPAIR WITH INSERTION OF MESH (Right )     Patient location during evaluation: PACU Anesthesia Type: Regional and General Level of consciousness: awake Pain management: pain level controlled Vital Signs Assessment: post-procedure vital signs reviewed and stable Respiratory status: spontaneous breathing, nonlabored ventilation, respiratory function stable and patient connected to nasal cannula oxygen Cardiovascular status: blood pressure returned to baseline and stable Postop Assessment: no apparent nausea or vomiting Anesthetic complications: no    Last Vitals:  Vitals:   12/08/18 1429 12/08/18 1440  BP: (!) 145/79 (!) 142/76  Pulse: 73 70  Resp: 16 16  Temp: 36.5 C   SpO2: 97% 95%    Last Pain:  Vitals:   12/08/18 1440  TempSrc:   PainSc: 0-No pain                 Ryan P Ellender

## 2018-12-08 NOTE — Progress Notes (Addendum)
1202 42mcg Fentynal per Dr. Roanna Banning 1206 50 mcg Fentynal per Dr. Roanna Banning AssistedDr. Ellender with right, ultrasound guided, transabdominal plane block. Side rails up, monitors on throughout procedure. See vital signs in flow sheet. Tolerated Procedure well.

## 2018-12-08 NOTE — Anesthesia Procedure Notes (Signed)
Procedure Name: Intubation Date/Time: 12/08/2018 12:37 PM Performed by: Sharlette Dense, CRNA Patient Re-evaluated:Patient Re-evaluated prior to induction Oxygen Delivery Method: Circle system utilized Preoxygenation: Pre-oxygenation with 100% oxygen Induction Type: IV induction Ventilation: Mask ventilation without difficulty Laryngoscope Size: Miller and 3 Grade View: Grade I Tube type: Oral Tube size: 8.0 mm Number of attempts: 1 Airway Equipment and Method: Stylet Placement Confirmation: ETT inserted through vocal cords under direct vision,  positive ETCO2 and breath sounds checked- equal and bilateral Secured at: 20 cm Tube secured with: Tape Dental Injury: Teeth and Oropharynx as per pre-operative assessment

## 2018-12-09 ENCOUNTER — Encounter (HOSPITAL_COMMUNITY): Payer: Self-pay | Admitting: General Surgery

## 2020-11-19 IMAGING — CT CT ABD-PELV W/ CM
2 of 5 series · 16 of 46 positions shown, 18 images · IV contrast (Omni 300)
Comparison: None.

CLINICAL DATA: Abdominal pain

EXAM:
CT ABDOMEN AND PELVIS WITH CONTRAST
TECHNIQUE: Multidetector CT imaging of the abdomen and pelvis was performed
using the standard protocol following bolus administration of
intravenous contrast.
CONTRAST:  100mL OMNIPAQUE IOHEXOL 300 MG/ML  SOLN

[Series 3: a/p w/ 5mm · axial · 0.71mm/px · z∈[+867,+1262]mm · 13 of 89 slices shown, 15 images]
[im 5/89  soft-tissue]
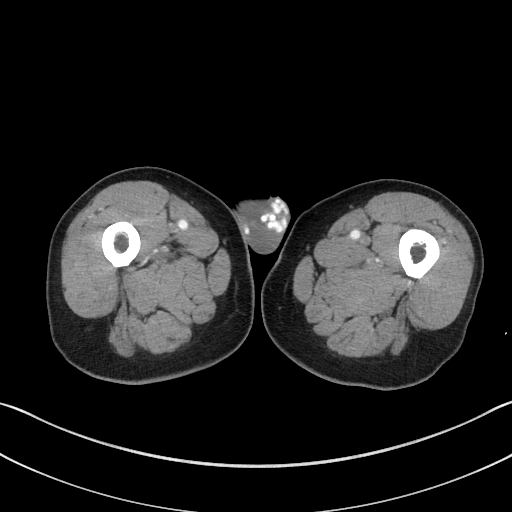
[im 5/89  bone]
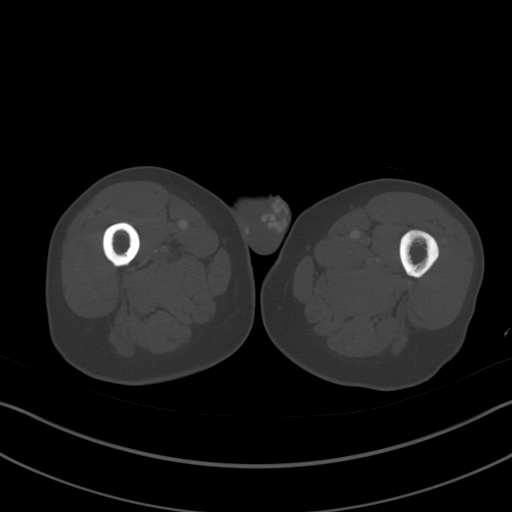
[im 14/89  soft-tissue]
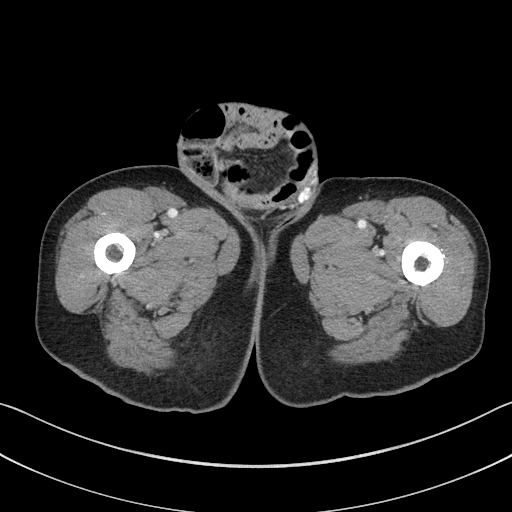
[im 18/89  soft-tissue]
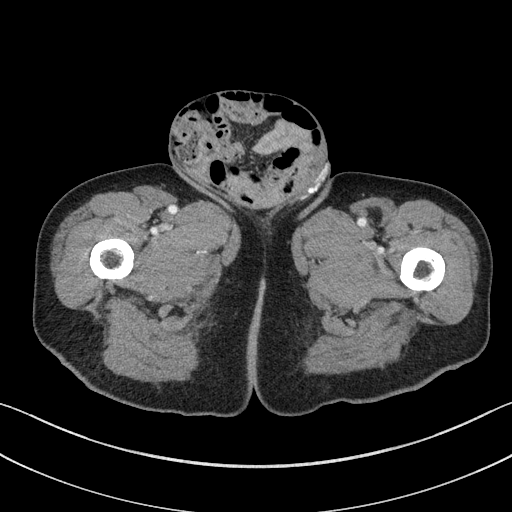
[im 27/89  soft-tissue]
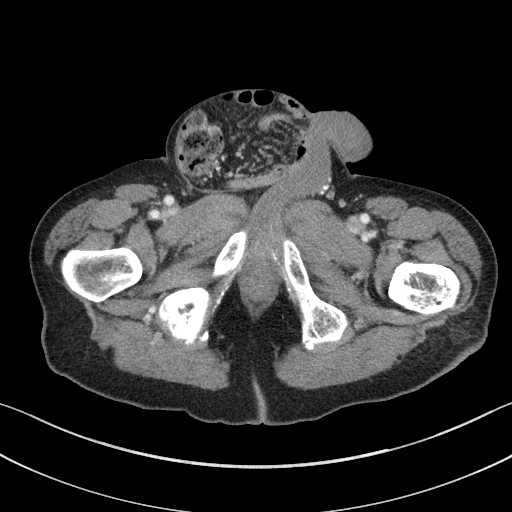
[im 31/89  soft-tissue]
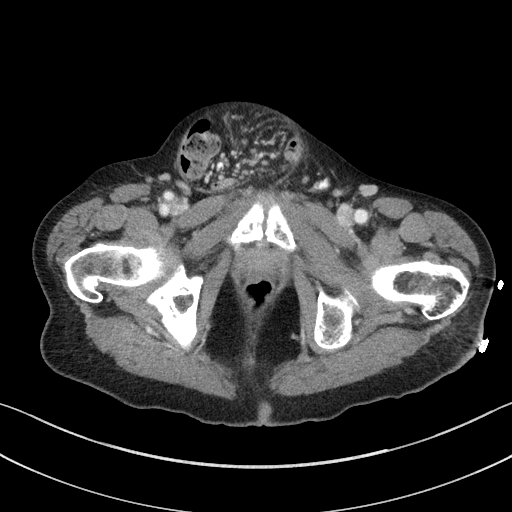
[im 40/89  soft-tissue]
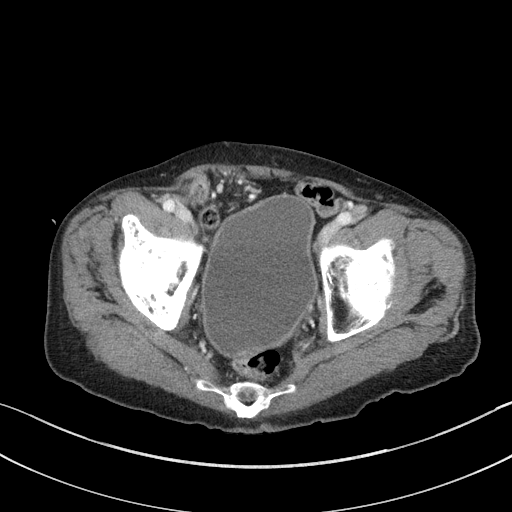
[im 45/89  soft-tissue]
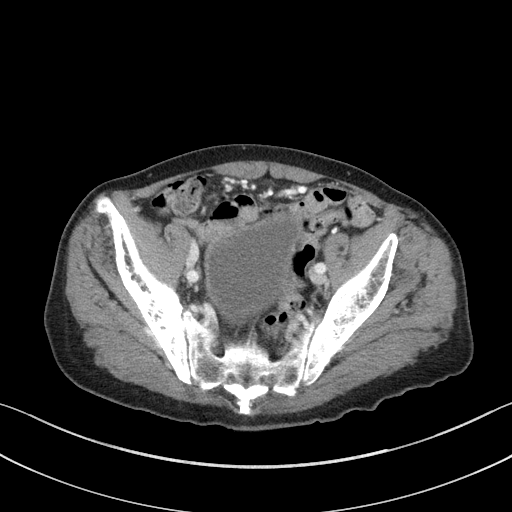
[im 49/89  soft-tissue]
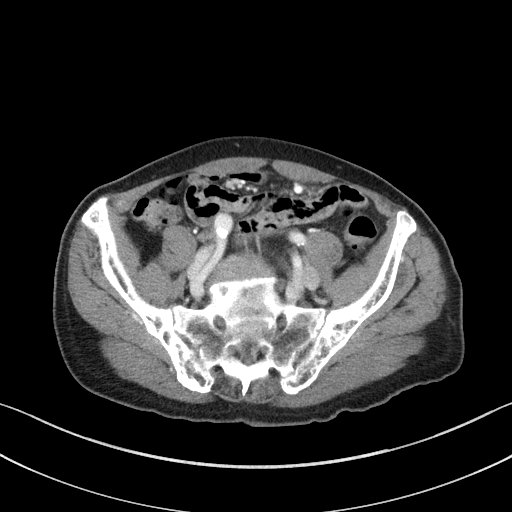
[im 58/89  soft-tissue]
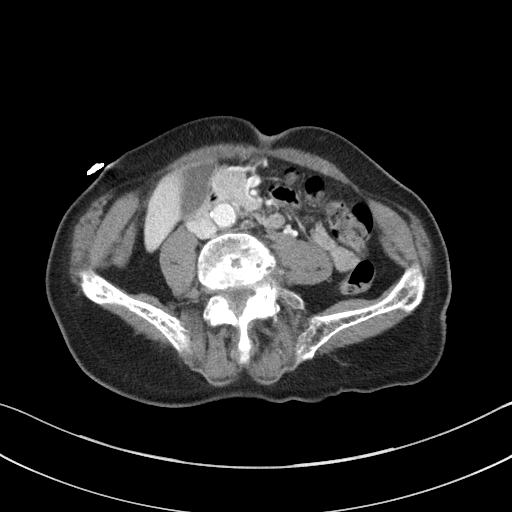
[im 58/89  bone]
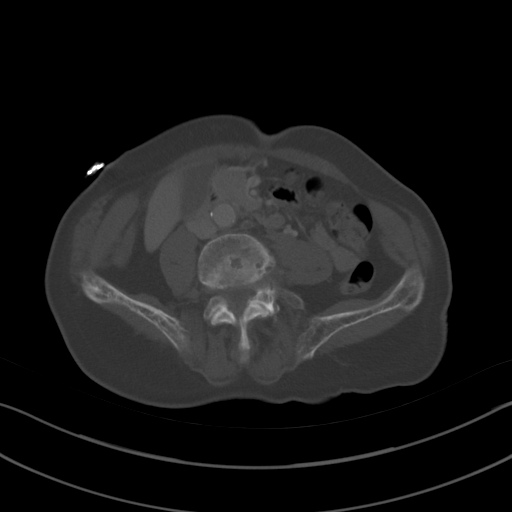
[im 62/89  soft-tissue]
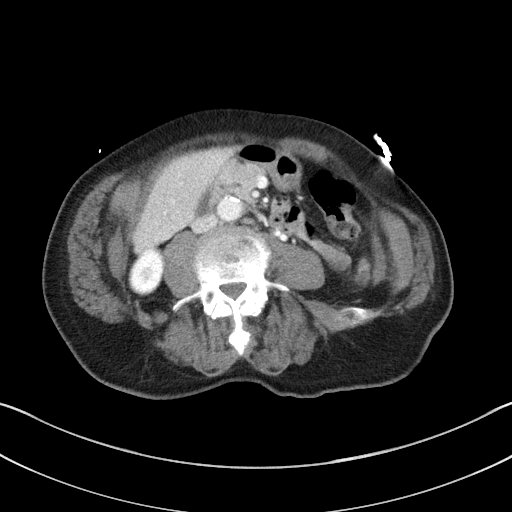
[im 71/89  soft-tissue]
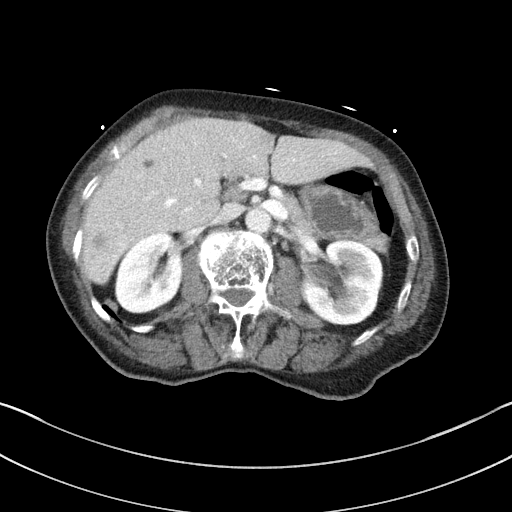
[im 75/89  soft-tissue]
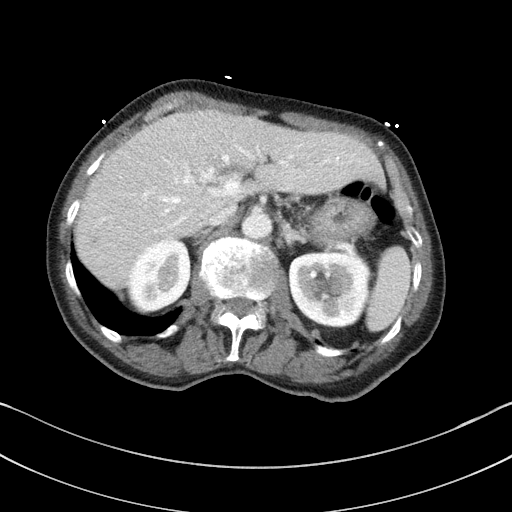
[im 84/89  soft-tissue]
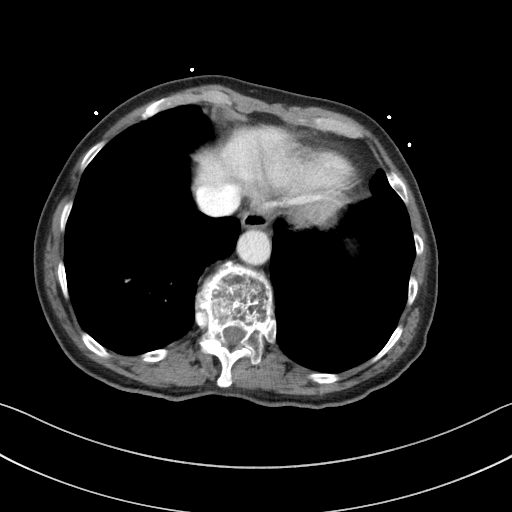

[Series 6: a/p w/ cor · coronal · 0.76mm/px · 3 of 129 slices shown]
[im 43/129  soft-tissue]
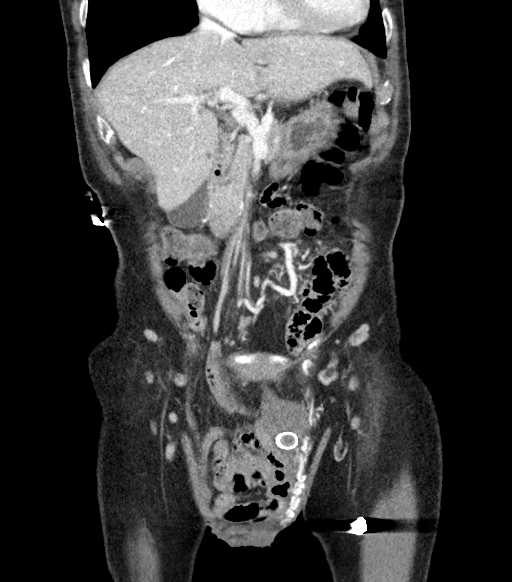
[im 57/129  soft-tissue]
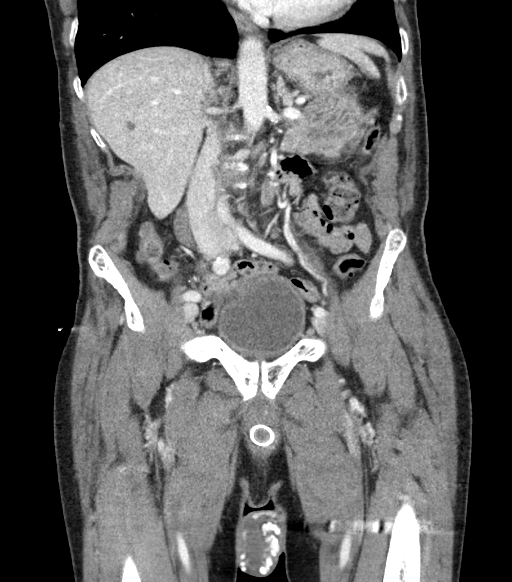
[im 72/129  soft-tissue]
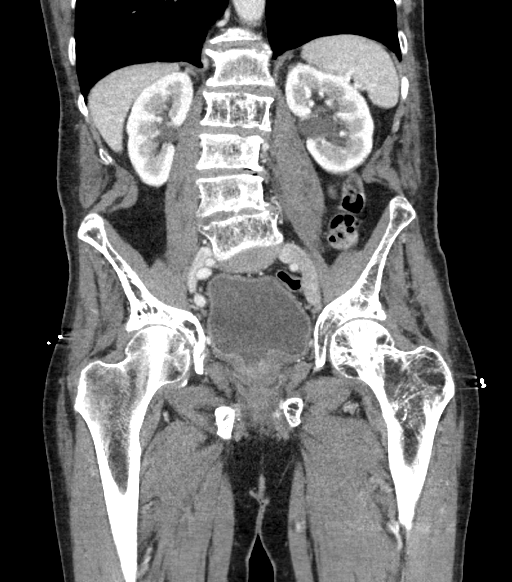

[16 of 46 positions shown; findings below may reference images not displayed]

FINDINGS: Lower chest: The visualized heart size within normal limits. No
pericardial fluid/thickening.

No hiatal hernia.

The visualized portions of the lungs are clear.

Hepatobiliary: Multiple low-density lesions are seen throughout the
liver parenchyma largest in the posterior right liver lobe measuring
2 cm. There is mildly heterogeneous parenchymal appearance question
mild intrahepatic biliary ductal dilatation.The main portal vein is
patent. No evidence of calcified gallstones, gallbladder wall
thickening or biliary dilatation.

Pancreas: Unremarkable. No pancreatic ductal dilatation or
surrounding inflammatory changes.

Spleen: Normal in size without focal abnormality.

Adrenals/Urinary Tract: Both adrenal glands appear normal. There is
mild left pelviectasis which tapers at the UPJ. No renal or
collecting system calculi. The bladder is grossly unremarkable.

Stomach/Bowel: The stomach, small bowel, and colon are normal in
appearance. No inflammatory changes, wall thickening, or obstructive
findings.

Vascular/Lymphatic: There are no enlarged mesenteric,
retroperitoneal, or pelvic lymph nodes. Scattered aortic
atherosclerotic calcifications are seen without aneurysmal
dilatation.

Reproductive: A penile prosthesis is seen. The patient is status
post prior prostatectomy.

Other: There is a large left inguinal hernia containing mesentery,
colon, and small bowel loops. No evidence of bowel strangulation
however are noted.

Musculoskeletal: The within the pelvis there is diffuse osteopenia
with trabecular thickening. This is non-specific could be due to
prior radiation changes/dysplasia.
IMPRESSION: 1. Mildly heterogeneous liver with question of mild central
intrahepatic biliary ductal dilatation. No definite cholelithiasis.
2. Multiple low-density lesions throughout the liver, likely hepatic
cysts.
3. Mild left renal pelviectasis. No renal or collecting system
calculi.
4. Large left inguinal hernia containing small bowel, colon, and
mesentery without bowel strangulation.

## 2021-09-02 ENCOUNTER — Ambulatory Visit
Admission: EM | Admit: 2021-09-02 | Discharge: 2021-09-02 | Disposition: A | Discharge status: Home / Self Care | Payer: Medicare Other | Attending: Physician Assistant | Admitting: Physician Assistant

## 2021-09-02 DIAGNOSIS — T63441A Toxic effect of venom of bees, accidental (unintentional), initial encounter: Secondary | ICD-10-CM | POA: Diagnosis not present

## 2021-09-02 MED ORDER — METHYLPREDNISOLONE SODIUM SUCC 125 MG IJ SOLR
80.0000 mg | Freq: Once | INTRAMUSCULAR | Status: AC
  Administered 2021-09-02: 80 mg via INTRAMUSCULAR

## 2021-09-02 NOTE — ED Provider Notes (Signed)
[facility]    CSN: [id number] Arrival date & time: 09/02/21  1343      History   Chief Complaint Chief Complaint  Patient presents with   Insect Bite    HPI Wayne Lam is a 87 y.o. male.   Patient here today with son for evaluation of yellow jacket stings to his right hand, right cheek. This occurred earlier today when he was trying to fix his storm door and did not realize there was a nest of yellow jackets under it. He has not had any shortness of breath, facial swelling, trouble swallowing. He does not have allergy to bees.   The history is provided by the patient.    Past Medical History:  Diagnosis Date   Prostate cancer ([place])     There are no problems to display for this patient.   Past Surgical History:  Procedure Laterality Date   INGUINAL HERNIA REPAIR Right 12/08/2018   Procedure: OPEN RIGHT INGUINAL HERNIA REPAIR WITH INSERTION OF MESH;  Surgeon: Kinsinger, Arta Bruce, MD;  Location: [facility];  Service: General;  Laterality: Right;       Home Medications    Prior to Admission medications   Medication Sig Start Date End Date Taking? Authorizing Provider  ibuprofen (ADVIL) 800 MG tablet Take 1 tablet (800 mg total) by mouth every 8 (eight) hours as needed. 12/08/18   Kinsinger, Arta Bruce, MD  oxyCODONE (OXY IR/ROXICODONE) 5 MG immediate release tablet Take 1 tablet (5 mg total) by mouth every 6 (six) hours as needed for severe pain. 12/08/18   Kinsinger, Arta Bruce, MD    Family History Family History  Family history unknown: Yes    Social History Social History   Tobacco Use   Smoking status: Former    Types: Cigarettes    Quit date: 12/05/1992    Years since quitting: 28.7   Smokeless tobacco: Never  Vaping Use   Vaping Use: Never used  Substance Use Topics   Alcohol use: Yes    Comment: occational   Drug use: No     Allergies   Patient has no known allergies.   Review of Systems Review of Systems  Constitutional:   Negative for chills and fever.  HENT:  Negative for facial swelling and trouble swallowing.   Eyes:  Negative for discharge and redness.  Respiratory:  Negative for shortness of breath.   Gastrointestinal:  Negative for nausea and vomiting.     Physical Exam Triage Vital Signs ED Triage Vitals  Enc Vitals Group     BP      Pulse      Resp      Temp      Temp src      SpO2      Weight      Height      Head Circumference      Peak Flow      Pain Score      Pain Loc      Pain Edu?      Excl. in [place]?    No data found.  Updated Vital Signs BP (!) 167/85   Pulse 66   Temp 98 F (36.7 C) (Oral)   Resp 16   SpO2 95%      Physical Exam Vitals and nursing note reviewed.  Constitutional:      General: He is not in acute distress.    Appearance: Normal appearance. He is not ill-appearing.  HENT:  Head: Normocephalic and atraumatic.  Eyes:     Conjunctiva/sclera: Conjunctivae normal.  Cardiovascular:     Rate and Rhythm: Normal rate.  Pulmonary:     Effort: Pulmonary effort is normal.  Musculoskeletal:     Comments: Mild diffuse swelling to right hand  Skin:    Comments: Scattered mildly erythematous papular lesions to right hand, single lesion to right cheek  Neurological:     Mental Status: He is alert.  Psychiatric:        Mood and Affect: Mood normal.        Behavior: Behavior normal.        Thought Content: Thought content normal.      UC Treatments / Results  Labs (all labs ordered are listed, but only abnormal results are displayed) Labs Reviewed - No data to display  EKG   Radiology No results found.  Procedures Procedures (including critical care time)  Medications Ordered in UC Medications  methylPREDNISolone sodium succinate (SOLU-MEDROL) 125 mg/2 mL injection 80 mg (80 mg Intramuscular Given 09/02/21 1513)    Initial Impression / Assessment and Plan / UC Course  I have reviewed the triage vital signs and the nursing  notes.  Pertinent labs & imaging results that were available during my care of the patient were reviewed by me and considered in my medical decision making (see chart for details).    Will treat with steroid injection. Encouraged follow up with any further concerns and report to ED with any facial swelling, shortness of breath,etc.   Final Clinical Impressions(s) / UC Diagnoses   Final diagnoses:  Bee sting, accidental or unintentional, initial encounter   Discharge Instructions   None    ED Prescriptions   None    PDMP not reviewed this encounter.   Francene Finders, PA-C 09/02/21 1523

## 2021-09-02 NOTE — ED Triage Notes (Signed)
Pt presents with insect stings from ground bees on a few different areas on his body today.

## 2021-12-10 DIAGNOSIS — H905 Unspecified sensorineural hearing loss: Secondary | ICD-10-CM | POA: Diagnosis not present

## 2022-01-02 DIAGNOSIS — R0600 Dyspnea, unspecified: Secondary | ICD-10-CM | POA: Diagnosis not present

## 2022-01-02 DIAGNOSIS — Z79899 Other long term (current) drug therapy: Secondary | ICD-10-CM | POA: Diagnosis not present

## 2022-01-02 DIAGNOSIS — Z87891 Personal history of nicotine dependence: Secondary | ICD-10-CM | POA: Diagnosis not present

## 2022-01-02 DIAGNOSIS — Z0000 Encounter for general adult medical examination without abnormal findings: Secondary | ICD-10-CM | POA: Diagnosis not present

## 2022-01-02 DIAGNOSIS — I700 Atherosclerosis of aorta: Secondary | ICD-10-CM | POA: Diagnosis not present

## 2022-01-02 DIAGNOSIS — I10 Essential (primary) hypertension: Secondary | ICD-10-CM | POA: Diagnosis not present

## 2022-01-02 DIAGNOSIS — R030 Elevated blood-pressure reading, without diagnosis of hypertension: Secondary | ICD-10-CM | POA: Diagnosis not present

## 2022-01-06 ENCOUNTER — Other Ambulatory Visit (HOSPITAL_COMMUNITY): Payer: Self-pay | Admitting: Family Medicine

## 2022-01-06 ENCOUNTER — Ambulatory Visit (HOSPITAL_COMMUNITY)
Admission: RE | Admit: 2022-01-06 | Discharge: 2022-01-06 | Disposition: A | Discharge status: Home / Self Care | Payer: Medicare Other | Source: Ambulatory Visit | Attending: Family Medicine | Admitting: Family Medicine

## 2022-01-06 DIAGNOSIS — R0600 Dyspnea, unspecified: Secondary | ICD-10-CM | POA: Diagnosis present
